# Patient Record
Sex: Female | Born: 1985 | Race: White | Hispanic: No | Marital: Married | Smoking: Never smoker
Health system: Southern US, Community
[De-identification: ages and names within clinical notes are randomized; demographics above are authoritative.]

---

## 2013-10-30 DIAGNOSIS — Z2913 Encounter for prophylactic Rho(D) immune globulin: Secondary | ICD-10-CM | POA: Insufficient documentation

## 2013-11-26 DIAGNOSIS — Z331 Pregnant state, incidental: Secondary | ICD-10-CM | POA: Insufficient documentation

## 2013-11-26 DIAGNOSIS — R6889 Other general symptoms and signs: Secondary | ICD-10-CM | POA: Insufficient documentation

## 2013-12-23 DIAGNOSIS — O3500X0 Maternal care for (suspected) central nervous system malformation or damage in fetus, unspecified, not applicable or unspecified: Secondary | ICD-10-CM | POA: Insufficient documentation

## 2014-04-22 DIAGNOSIS — A491 Streptococcal infection, unspecified site: Secondary | ICD-10-CM | POA: Insufficient documentation

## 2014-09-16 ENCOUNTER — Ambulatory Visit
Admission: EM | Admit: 2014-09-16 | Discharge: 2014-09-16 | Disposition: A | Discharge status: Home / Self Care | Payer: No Typology Code available for payment source | Attending: Emergency Medicine | Admitting: Emergency Medicine

## 2014-09-16 DIAGNOSIS — T148 Other injury of unspecified body region: Secondary | ICD-10-CM

## 2014-09-16 DIAGNOSIS — B349 Viral infection, unspecified: Secondary | ICD-10-CM | POA: Diagnosis not present

## 2014-09-16 DIAGNOSIS — T148XXA Other injury of unspecified body region, initial encounter: Secondary | ICD-10-CM

## 2014-09-16 LAB — RAPID STREP SCREEN (MED CTR MEBANE ONLY): STREPTOCOCCUS, GROUP A SCREEN (DIRECT): NEGATIVE

## 2014-09-16 MED ORDER — IBUPROFEN 800 MG PO TABS
800.0000 mg | ORAL_TABLET | Freq: Once | ORAL | Status: AC
  Administered 2014-09-16: 800 mg via ORAL

## 2014-09-16 NOTE — ED Notes (Signed)
Pt states "I have forearm, calf, ankle and feet pain. It is hard to open a door or pick up my four month old baby. I did fly to [place] on Monday and back Wednesday. I have a little bit of a sore throat but otherwise feel good."

## 2014-09-16 NOTE — ED Provider Notes (Signed)
CSN: [id number]     Arrival date & time 09/16/14  1639 History   First MD Initiated Contact with Patient 09/16/14 1710     Chief Complaint  Patient presents with  . Arm Pain  . Leg Pain   (Consider location/radiation/quality/duration/timing/severity/associated sxs/prior Treatment) HPI   29 yo F spent last 4 days in [place] arriving home late last night. Has had a scratchy throat. Has bilateral forearm discomfort, bilateral calf discomfort and bilateral ankle discomfort. Wore sneakers for most of the trip .Did not  go hiking. Conferences on second floor of building  ,Had bookbag and pulled suitcase. Husband travelled as well and has no discomfort.  He is taller and she has to push to keep up with him at fast walk pace/airports and sidewalks.. She is on Mirena for [facility] Took one tylenol and one ibuprofen over the past 2 days for therapy.  Her mother is concerned that she be seen - mom has been home with the  63 mos old baby Afebrile , VS good History reviewed. No pertinent past medical history. History reviewed. No pertinent past surgical history. History reviewed. No pertinent family history. Social History  Substance Use Topics  . Smoking status: Never Smoker   . Smokeless tobacco: None  . Alcohol Use: No   OB History    Gravida Para Term Preterm AB TAB SAB Ectopic Multiple Living   1 1        1       Obstetric Comments   46 month old infant of the home     Review of Systems   Constitutional -afebrile Eyes-denies visual changes ENT- normal voice,reports intermittent scratchy throat  sore throat CV-denies chest pain Resp-denies SOB GI- negative for nausea,vomiting, diarrhea GU- negative for dysuria MSK- negative for back pain, ambulatory; has bilateral forearm , bilateral calf and ankle discomfort- no specific , no numbness or tingling Skin- denies acute changes Neuro- negative headache,focal weakness or numbness    Allergies  Review of patient's allergies indicates no known  allergies.  Home Medications   Prior to Admission medications   Medication Sig Start Date End Date Taking? Authorizing Provider  levonorgestrel (MIRENA) 20 MCG/24HR IUD 1 each by Intrauterine route once.   Yes Historical Provider, MD   Meds Ordered and Administered this Visit   Medications  ibuprofen (ADVIL,MOTRIN) tablet 800 mg (800 mg Oral Given 09/16/14 1725)  well tolerated  BP 122/70 mmHg  Pulse 70  Temp(Src) 98.4 F (36.9 C) (Oral)  Resp 16  Ht 5\' 2"  (1.575 m)  Wt 155 lb (70.308 kg)  BMI 28.34 kg/m2  SpO2 100% No data found.   Physical Exam    Constitutional -alert and oriented,well appearing and in no acute distress Head-atraumatic, normocephalic Eyes- conjunctiva normal, EOMI ,conjugate gaze Nose- no congestion or rhinorrhea Mouth/throat- mucous membranes moist ,scratchy throat; Strep Neg Neck- supple without glandular enlargement CV- regular rate, grossly normal heart sounds,  Resp-no distress, normal respiratory effort,clear to auscultation bilaterally Back- no CVAT, no spinal column tenderness or paraspinous spasm /tenderness palpated, FROM GI- soft,non-tender,no distention GU-deferred MSK-Bilateral forearms  Reported as tender/ FROM , good pulses, warm , good cap fill, no numbness or tingling , good grips equal, normal ROM,  Bilateral legs with concern of bilateral calf tenderness. Used to be softball player, calves are well developed.  No point tenderness , good pulses, warm toes good cap fill. all extremities, ambulatory, can toe walk and heel walk, squat and return without support. Drove self here, ambulatory  in unit.  Self-care Neuro- normal speech and language, no gross focal neurological deficit appreciated, no gait instability, Skin-warm,dry ,intact; no rash noted Psych-mood and affect grossly normal; speech and behavior grossly normal  ED Course  Procedures (including critical care time)  Labs Review Labs Reviewed  RAPID STREP SCREEN (NOT AT [facility])   CULTURE, GROUP A STREP ([facility] ONLY)   Results for orders placed or performed during the hospital encounter of 09/16/14  Rapid strep screen  Result Value Ref Range   Streptococcus, Group A Screen (Direct) NEGATIVE NEGATIVE  Culture, group A strep ([facility] only)  Result Value Ref Range   Specimen Description THROAT    Special Requests NONE    Culture NO BETA STREPTOCOCCUS ISOLATED IN 12 HOURS    Report Status PENDING    Imaging Review No results found.    Nothing in her exam suggests focal pain,heat , erythema  or DVT- but rather generalized muscle tenderness,from strain and travel. Discussed signs and symptoms to respond to for increasing issues. Also reviewed Neg Strep and 3 day call back. Viral issues for fatigue and scratchy throat She has been on closed conference rooms, planes and waiting rooms . Encourage increased po fluids, tylenol/ibprofen, rest as able, return to stretch program she remembers from team. Report back if low dose Rx doesn't adequately control discomfort if taken regularly for a few days  MDM   1. Muscle strain   2. Viral syndrome    Diagnosis and treatment discussed.  Questions fielded, expectations and recommendations reviewed.  Patient expresses understanding. Will return to [facility] with questions, concern or exacerbation.     Rae Halsted, PA-C 09/17/14 1818

## 2014-09-19 LAB — CULTURE, GROUP A STREP (THRC)

## 2014-09-22 NOTE — ED Provider Notes (Signed)
CSN: [id number]     Arrival date & time 09/16/14  1639 History   First MD Initiated Contact with Patient 09/16/14 1710     Chief Complaint  Patient presents with  . Arm Pain  . Leg Pain   (Consider location/radiation/quality/duration/timing/severity/associated sxs/prior Treatment) HPI  History reviewed. No pertinent past medical history. History reviewed. No pertinent past surgical history. History reviewed. No pertinent family history. Social History  Substance Use Topics  . Smoking status: Never Smoker   . Smokeless tobacco: None  . Alcohol Use: No   OB History    Gravida Para Term Preterm AB TAB SAB Ectopic Multiple Living   Obstetric Comments   58 month old infant of the home     Review of Systems  Allergies  Review of patient's allergies indicates no known allergies.  Home Medications   Prior to Admission medications   Medication Sig Start Date End Date Taking? Authorizing Provider  levonorgestrel (MIRENA) 20 MCG/24HR IUD 1 each by Intrauterine route once.   Yes Historical Provider, MD   Meds Ordered and Administered this Visit   Medications  ibuprofen (ADVIL,MOTRIN) tablet 800 mg (800 mg Oral Given 09/16/14 1725)    BP 122/70 mmHg  Pulse 70  Temp(Src) 98.4 F (36.9 C) (Oral)  Resp 16  Ht  (1.575 m)  Wt 155 lb (70.308 kg)  BMI 28.34 kg/m2  SpO2 100% No data found.   Physical Exam  ED Course  Procedures (including critical care time)  Labs Review Labs Reviewed  RAPID STREP SCREEN (NOT AT [facility])  CULTURE, GROUP A STREP ([facility] ONLY)    Imaging Review No results found.   Visual Acuity Review  Right Eye Distance:   Left Eye Distance:   Bilateral Distance:    Right Eye Near:   Left Eye Near:    Bilateral Near:         MDM   1. Muscle strain   2. Viral syndrome    8/31- strep cx neg - Konrad Saha, MD  Domenick Gong, MD 09/22/14 [phone]

## 2015-06-16 ENCOUNTER — Ambulatory Visit (INDEPENDENT_AMBULATORY_CARE_PROVIDER_SITE_OTHER): Payer: BLUE CROSS/BLUE SHIELD

## 2015-06-16 ENCOUNTER — Ambulatory Visit
Admission: EM | Admit: 2015-06-16 | Discharge: 2015-06-16 | Disposition: A | Discharge status: Home / Self Care | Payer: BLUE CROSS/BLUE SHIELD | Attending: Emergency Medicine | Admitting: Emergency Medicine

## 2015-06-16 ENCOUNTER — Encounter: Payer: Self-pay | Admitting: *Deleted

## 2015-06-16 DIAGNOSIS — M67431 Ganglion, right wrist: Secondary | ICD-10-CM | POA: Diagnosis not present

## 2015-06-16 MED ORDER — IBUPROFEN 800 MG PO TABS: 800.0000 mg | ORAL_TABLET | Freq: Three times a day (TID) | ORAL | Status: DC | PRN

## 2015-06-16 NOTE — ED Notes (Signed)
Patient started having right wrist pain 2 weeks ago and symptoms have not resolved. Patient does have a history of right wrist pain due to sports injuries.

## 2015-06-16 NOTE — ED Provider Notes (Signed)
HPI  SUBJECTIVE:  Patricia West is a 30 y.o. female who presents with painful swelling on the dorsum of her right wrist starting approximately 2 weeks ago. 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 No numbness, tingling, color changes, recent trauma to her wrists, numbness, tingling, pain waking her up at night, erythema, fevers. Symptoms are worse with hyperextension, she has tried an Ace wrap. There are no alleviating factors. No antipyretic in the past 6-8 hours. Pain is not associated with wrist flexion or radial or ulnar deviation. Past medical history of right wrist fracture. No history of diabetes, hypertension, smoking. LMP: Amenorrheic secondary to IUD, denies possibility of being pregnant. PMD: None.   History reviewed. No pertinent past medical history.  History reviewed. No pertinent past surgical history.  History reviewed. No pertinent family history.  Social History  Substance Use Topics  . Smoking status: Never Smoker   . Smokeless tobacco: Never Used  . Alcohol Use: No    No current facility-administered medications for this encounter.  Current outpatient prescriptions:  .  ibuprofen (ADVIL,MOTRIN) 800 MG tablet, Take 1 tablet (800 mg total) by mouth every 8 (eight) hours as needed for mild pain or moderate pain., Disp: 30 tablet, Rfl: 0 .  levonorgestrel (MIRENA) 20 MCG/24HR IUD, 1 each by Intrauterine route once., Disp: , Rfl:   No Known Allergies   ROS  As noted in HPI.   Physical Exam  BP 114/73 mmHg  Pulse 71  Temp(Src) 98.2 F (36.8 C) (Oral)  Resp 18  Ht  (1.6 m)  Wt 165 lb (74.844 kg)  BMI 29.24 kg/m2  SpO2 100%  Constitutional: Well developed, well nourished, no acute distress Eyes:  EOMI, conjunctiva normal bilaterally HENT: Normocephalic, atraumatic,mucus membranes moist Respiratory: Normal inspiratory effort Cardiovascular: Normal rate GI: nondistended skin: No rash, skin  intact Musculoskeletal: Mildly tender swelling right dorsum hand consistent with a ganglion cyst, no erythema, increased temperature. distal radius NT, distal ulnar styloid NT, snuffbox NT, carpals NT, metacarpals NT, digits NT, sensation and motor intact in the median/radial/ulnar distribution. Cap refill less than 2 seconds distally.  Elbow and proximal forearm NT. Neurologic: Alert & oriented x 3, no focal neuro deficits Psychiatric: Speech and behavior appropriate   ED Course   Medications - No data to display  Orders Placed This Encounter  Procedures  . DG Wrist Complete Right    Standing Status: Standing     Number of Occurrences: 1     Standing Expiration Date:     Order Specific Question:  Reason for Exam (SYMPTOM  OR DIAGNOSIS REQUIRED)    Answer:  r/o fx, dislocation    No results found for this or any previous visit (from the past 24 hour(s)). Dg Wrist Complete Right  06/16/2015  CLINICAL DATA:  Right wrist pain. EXAM: RIGHT WRIST - COMPLETE 3+ VIEW COMPARISON:  None. FINDINGS: There is no evidence of fracture or dislocation. There is no evidence of arthropathy or other focal bone abnormality. Soft tissues are unremarkable. IMPRESSION: Negative. Electronically Signed   By: Ted Mcalpine M.D.   On: 06/16/2015 19:47    ED Clinical Impression  Ganglion cyst of wrist, right   ED Assessment/Plan  Presentation consistent with a noninfected ganglion cyst. Obtaining wrist x-ray given remote history of trauma to rule out any acute changes.  Imaging independently reviewed.  No fracture, dislocation. See radiology report for details.   Home with ibuprofen. Patient declined a wrist  splint and a prescription for tramadol. Will refer to Dr. Amanda PeaGramig or  Dr. Melvyn Novasrtmann [facility] for further management.   Discussed  imaging, MDM, plan and followup with patient . Discussed sn/sx that should prompt return to the ED. Patient  agrees with plan.   *This clinic note was  created using Dragon dictation software. Therefore, there may be occasional mistakes despite careful proofreading.  ?    Domenick GongAshley Juda Toepfer, MD 06/16/15 2110

## 2017-04-25 IMAGING — CR DG WRIST COMPLETE 3+V*R*
4 series · 4 of 4 positions shown · non-contrast
Comparison: None.

CLINICAL DATA: Right wrist pain.

EXAM:
RIGHT WRIST - COMPLETE 3+ VIEW

[wrist pa]
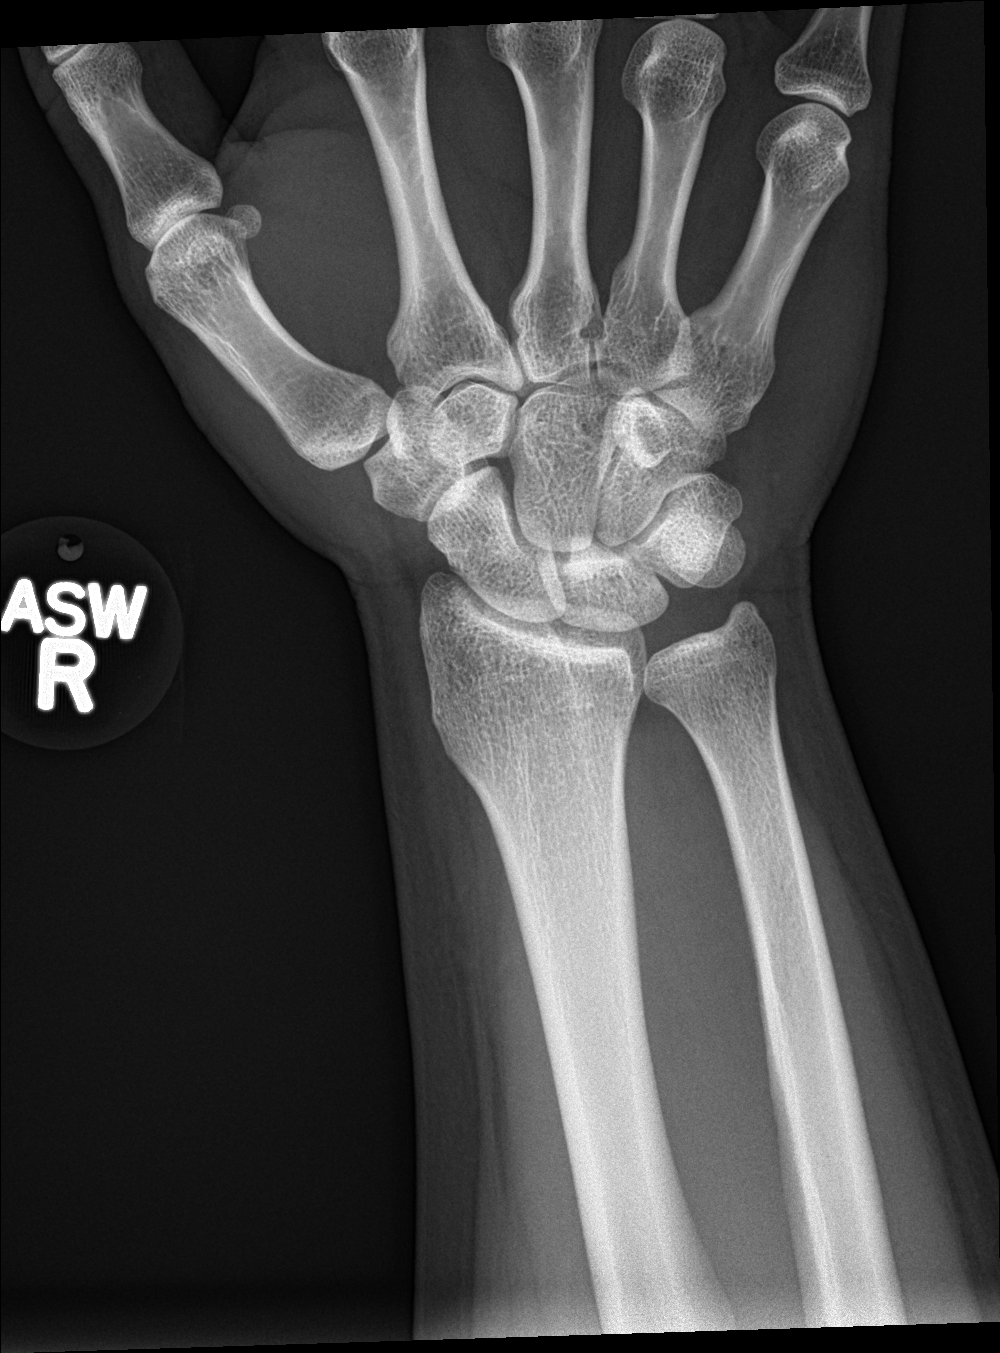

[wrist obl]
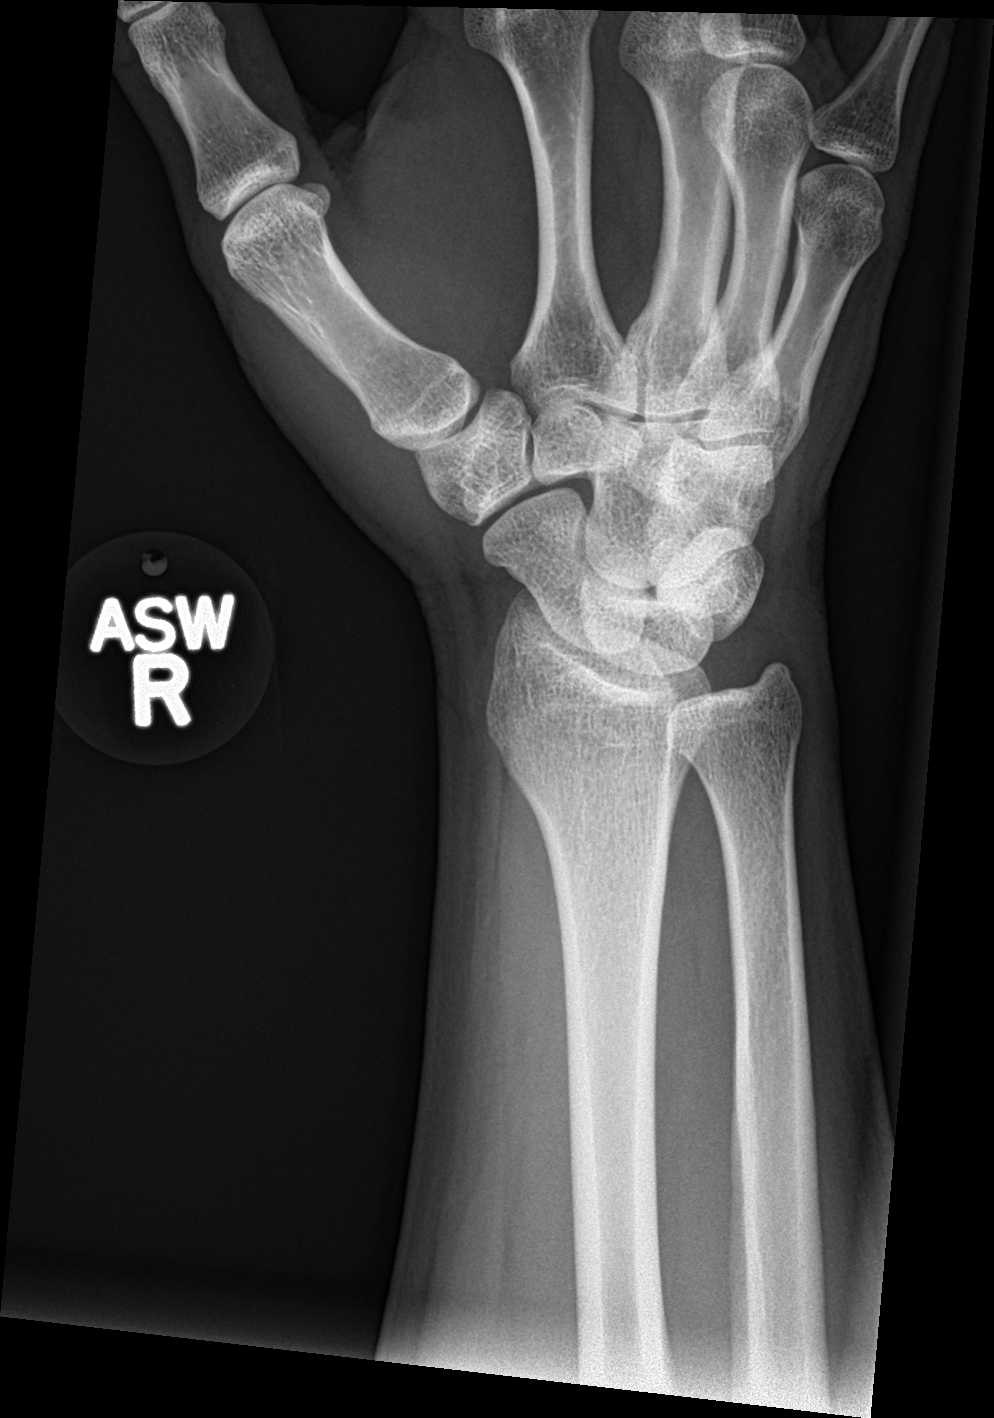

[wrist lat]
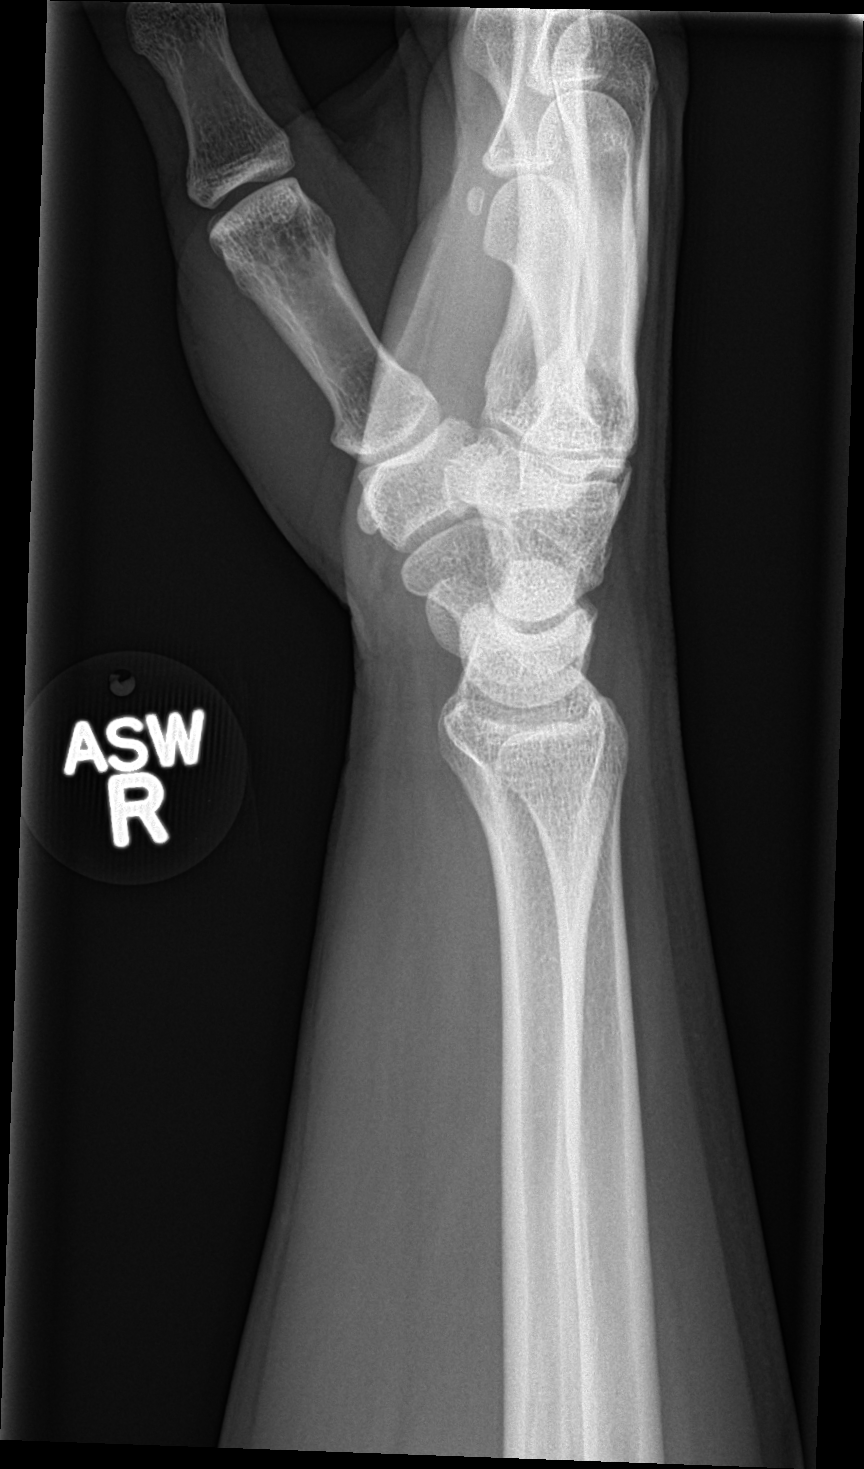

[wrist navicular]
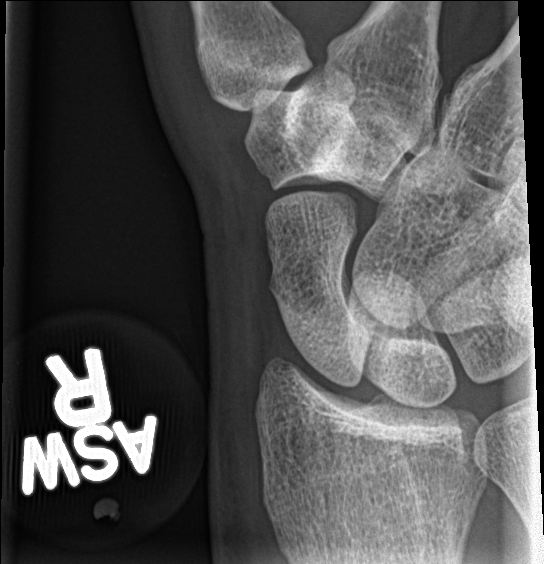

[4 of 4 positions shown; findings below may reference images not displayed]

FINDINGS: There is no evidence of fracture or dislocation. There is no
evidence of arthropathy or other focal bone abnormality. Soft
tissues are unremarkable.
IMPRESSION: Negative.

## 2019-01-19 DIAGNOSIS — M5416 Radiculopathy, lumbar region: Secondary | ICD-10-CM | POA: Insufficient documentation

## 2019-01-20 ENCOUNTER — Ambulatory Visit (INDEPENDENT_AMBULATORY_CARE_PROVIDER_SITE_OTHER): Payer: BLUE CROSS/BLUE SHIELD | Admitting: Podiatry

## 2019-01-20 ENCOUNTER — Other Ambulatory Visit: Payer: Self-pay

## 2019-01-20 ENCOUNTER — Ambulatory Visit (INDEPENDENT_AMBULATORY_CARE_PROVIDER_SITE_OTHER): Payer: BLUE CROSS/BLUE SHIELD

## 2019-01-20 ENCOUNTER — Encounter: Payer: Self-pay | Admitting: Podiatry

## 2019-01-20 VITALS — BP 90/53

## 2019-01-20 DIAGNOSIS — M2141 Flat foot [pes planus] (acquired), right foot: Secondary | ICD-10-CM

## 2019-01-20 DIAGNOSIS — M2010 Hallux valgus (acquired), unspecified foot: Secondary | ICD-10-CM

## 2019-01-20 DIAGNOSIS — M2142 Flat foot [pes planus] (acquired), left foot: Secondary | ICD-10-CM

## 2019-01-20 DIAGNOSIS — M79672 Pain in left foot: Secondary | ICD-10-CM | POA: Diagnosis not present

## 2019-01-20 DIAGNOSIS — M79671 Pain in right foot: Secondary | ICD-10-CM

## 2019-01-20 DIAGNOSIS — L600 Ingrowing nail: Secondary | ICD-10-CM | POA: Diagnosis not present

## 2019-01-20 NOTE — Progress Notes (Signed)
Subjective:  Patient ID: Patricia West, female    DOB: [birth year],  MRN: [id number]  Chief Complaint  Patient presents with  . Foot Pain    pt has bil bunions, going on for multiple years, pt also states that pain is aggravated at night time, pt also states that she has a possible ingrown toenail of the right big toenail lateral side    33 y.o. female presents with the above complaint.  Patient presented with primary complaint of right lateral ingrown hallux/big toe.  Patient states been going for about area.  Patient states the ingrown is on and off.  Is worse when ambulating with sneakers.  Patient said there has not been pus or swelling but has been causing her a lot of pain and discomfort.  She denies any other treatment options.  She denies any other acute complaints.  Patient also has a secondary complaint of bilateral bunions.  They have been going on for 17 years.  Patient was very active in high school and had orthotics made about 17 years ago.  Patient states that she has been wearing it mostly except for last couple of years.  Patient states that he causes an achy sensation to both of the bunion especially wearing her Nike shoes.  I educated her on the importance of changing the shoe gears with new balance Brooks or [employer]ics.  Patient states that she will do that right away.  She states it is aggravated when going to bed after she has been on her feet.  Patient said he has tried inserts but has not been helping.  She would also like to get new custom-made orthotics.   Review of Systems: Negative except as noted in the HPI. Denies N/V/F/Ch.  No past medical history on file.  Current Outpatient Medications:  .  ibuprofen (ADVIL,MOTRIN) 800 MG tablet, Take 1 tablet (800 mg total) by mouth every 8 (eight) hours as needed for mild pain or moderate pain., Disp: 30 tablet, Rfl: 0 .  levonorgestrel (MIRENA) 20 MCG/24HR IUD, 1 each by Intrauterine route once., Disp: , Rfl:   Social History    Tobacco Use  Smoking Status Never Smoker  Smokeless Tobacco Never Used    No Known Allergies Objective:   Vitals:   01/20/19 0947  BP: (!) 90/53   There is no height or weight on file to calculate BMI. Constitutional Well developed. Well nourished.  Vascular Dorsalis pedis pulses palpable bilaterally. Posterior tibial pulses palpable bilaterally. Capillary refill normal to all digits.  No cyanosis or clubbing noted. Pedal hair growth normal.  Neurologic Normal speech. Oriented to person, place, and time. Epicritic sensation to light touch grossly present bilaterally.  Dermatologic Painful ingrowing nail at lateral nail borders of the hallux nail right. No other open wounds. No skin lesions.  Orthopedic: Normal joint ROM without pain or crepitus bilaterally. No visible deformities. No bony tenderness.   Radiographs: Bilateral AP and lateral views of skeletally mature adult foot were reviewed: There is an increase in soft tissue density and volume around the first metatarsophalangeal joint.  There is hypertrophy of medial eminence.  There is subchondral sclerosis present at the base of the proximal phalanx.  There is an increase in intermetatarsal angle the sesamoid position is 5 out of 7.  There is a mild dorsal elevatus present bilaterally. No other bony deformities identified. Assessment:   1. Acquired hallux valgus, unspecified laterality   2. Ingrown toenail of right foot   3. Pain in right foot  4. Pes planus of both feet    Plan:  Patient was evaluated and treated and all questions answered.  Ingrown Nail, right -Patient elects to proceed with minor surgery to remove ingrown toenail removal today. Consent reviewed and signed by patient. -Ingrown nail excised. See procedure note. -Educated on post-procedure care including soaking. Written instructions provided and reviewed. -Patient to follow up in 2 weeks for nail check.  Bilateral hallux abductovalgus  deformities noted right worse than left secondary to pes planus deformity flexible. -I explained to the patient the etiology of bunion deformity and various treatment options associated with it.  I educated her on the importance of bunion protector as well as shoe gear modification with wider toe box to help with the pain and the rubbing.  Patient states that she will obtain those type of shoes. -I believe patient will benefit from custom-made orthotics to help address the hindfoot motion as well as ankle support the arches of the foot and possibly prevent progression of the bunion from getting worse. -Patient will be scheduled to see [facility] for custom-made orthotics.  Procedure: Excision of Ingrown Toenail Location: Right 1st toe lateral nail borders. Anesthesia: Lidocaine 1% plain; 1.5 mL and Marcaine 0.5% plain; 1.5 mL, digital block. Skin Prep: Betadine. Dressing: Silvadene; telfa; dry, sterile, compression dressing. Technique: Following skin prep, the toe was exsanguinated and a tourniquet was secured at the base of the toe. The affected nail border was freed, split with a nail splitter, and excised. Chemical matrixectomy was then performed with phenol and irrigated out with alcohol. The tourniquet was then removed and sterile dressing applied. Disposition: Patient tolerated procedure well. Patient to return in 2 weeks for follow-up.   Return in about 3 months (around 04/20/2019), or f/u for ingrown, for also See [facility] for orthotics.

## 2019-01-20 NOTE — Patient Instructions (Signed)
Pre-Operative Instructions  Congratulations, you have decided to take an important step towards improving your quality of life.  You can be assured that the doctors and staff at [facility] will be with you every step of the way.  Here are some important things you should know:  1. Plan to be at the surgery center/hospital at least 1 (one) hour prior to your scheduled time, unless otherwise directed by the surgical center/hospital staff.  You must have a responsible adult accompany you, remain during the surgery and drive you home.  Make sure you have directions to the surgical center/hospital to ensure you arrive on time. 2. If you are having surgery at [facility] or [facility], you will need a copy of your medical history and physical form from your family physician within one month prior to the date of surgery. We will give you a form for your primary physician to complete.  3. We make every effort to accommodate the date you request for surgery.  However, there are times where surgery dates or times have to be moved.  We will contact you as soon as possible if a change in schedule is required.   4. No aspirin/ibuprofen for one week before surgery.  If you are on aspirin, any non-steroidal anti-inflammatory medications (Mobic, Aleve, Ibuprofen) should not be taken seven (7) days prior to your surgery.  You make take Tylenol for pain prior to surgery.  5. Medications - If you are taking daily heart and blood pressure medications, seizure, reflux, allergy, asthma, anxiety, pain or diabetes medications, make sure you notify the surgery center/hospital before the day of surgery so they can tell you which medications you should take or avoid the day of surgery. 6. No food or drink after midnight the night before surgery unless directed otherwise by surgical center/hospital staff. 7. No alcoholic beverages 24-hours prior to surgery.  No smoking 24-hours prior or 24-hours after  surgery. 8. Wear loose pants or shorts. They should be loose enough to fit over bandages, boots, and casts. 9. Don't wear slip-on shoes. Sneakers are preferred. 10. Bring your boot with you to the surgery center/hospital.  Also bring crutches or a walker if your physician has prescribed it for you.  If you do not have this equipment, it will be provided for you after surgery. 11. If you have not been contacted by the surgery center/hospital by the day before your surgery, call to confirm the date and time of your surgery. 12. Leave-time from work may vary depending on the type of surgery you have.  Appropriate arrangements should be made prior to surgery with your employer. 13. Prescriptions will be provided immediately following surgery by your doctor.  Fill these as soon as possible after surgery and take the medication as directed. Pain medications will not be refilled on weekends and must be approved by the doctor. 14. Remove nail polish on the operative foot and avoid getting pedicures prior to surgery. 15. Wash the night before surgery.  The night before surgery wash the foot and leg well with water and the antibacterial soap provided. Be sure to pay special attention to beneath the toenails and in between the toes.  Wash for at least three (3) minutes. Rinse thoroughly with water and dry well with a towel.  Perform this wash unless told not to do so by your physician.  Enclosed: 1 Ice pack (please put in freezer the night before surgery)   1 Hibiclens skin cleaner     Pre-op instructions  If you have any questions regarding the instructions, please do not hesitate to call our office.  [place]: [address], [place], [place] [address] -- [phone]  [place]: [address] [place], [place] [address] -- [phone]  [place]: [address], [place], [place] [address] -- [phone]   Website: [email or web address] 

## 2019-02-11 ENCOUNTER — Ambulatory Visit (INDEPENDENT_AMBULATORY_CARE_PROVIDER_SITE_OTHER): Payer: BLUE CROSS/BLUE SHIELD | Admitting: Orthotics

## 2019-02-11 ENCOUNTER — Other Ambulatory Visit: Payer: Self-pay

## 2019-02-11 DIAGNOSIS — M2141 Flat foot [pes planus] (acquired), right foot: Secondary | ICD-10-CM | POA: Diagnosis not present

## 2019-02-11 DIAGNOSIS — M2142 Flat foot [pes planus] (acquired), left foot: Secondary | ICD-10-CM

## 2019-02-11 DIAGNOSIS — M2010 Hallux valgus (acquired), unspecified foot: Secondary | ICD-10-CM

## 2019-02-11 NOTE — Progress Notes (Signed)
Patient came into today to be cast for Custom Foot Orthotics. Upon recommendation of Dr.  Allena Katz Patient presents with b/L hallux valgus Goals are arch support, offload 1st mpj b/l Plan vendor Richy

## 2019-03-17 ENCOUNTER — Encounter: Payer: Self-pay | Admitting: Podiatry

## 2019-03-17 ENCOUNTER — Other Ambulatory Visit: Payer: Self-pay

## 2019-03-17 ENCOUNTER — Ambulatory Visit: Payer: BLUE CROSS/BLUE SHIELD | Admitting: Orthotics

## 2019-03-17 DIAGNOSIS — M2010 Hallux valgus (acquired), unspecified foot: Secondary | ICD-10-CM

## 2019-03-17 DIAGNOSIS — M2142 Flat foot [pes planus] (acquired), left foot: Secondary | ICD-10-CM

## 2019-03-17 DIAGNOSIS — M2141 Flat foot [pes planus] (acquired), right foot: Secondary | ICD-10-CM

## 2019-03-17 NOTE — Progress Notes (Signed)
Patient came in today to pick up custom made foot orthotics.  The goals were accomplished and the patient reported no dissatisfaction with said orthotics.  Patient was advised of breakin period and how to report any issues. 

## 2019-03-25 ENCOUNTER — Other Ambulatory Visit: Payer: BLUE CROSS/BLUE SHIELD | Admitting: Orthotics

## 2019-04-21 ENCOUNTER — Ambulatory Visit: Payer: BLUE CROSS/BLUE SHIELD | Admitting: Podiatry

## 2023-08-04 ENCOUNTER — Encounter: Payer: Self-pay | Admitting: Emergency Medicine

## 2023-08-04 ENCOUNTER — Ambulatory Visit
Admission: EM | Admit: 2023-08-04 | Discharge: 2023-08-04 | Disposition: A | Discharge status: Home / Self Care | Attending: Emergency Medicine | Admitting: Emergency Medicine

## 2023-08-04 DIAGNOSIS — L247 Irritant contact dermatitis due to plants, except food: Secondary | ICD-10-CM

## 2023-08-04 MED ORDER — DEXAMETHASONE SODIUM PHOSPHATE 10 MG/ML IJ SOLN
10.0000 mg | Freq: Once | INTRAMUSCULAR | Status: AC
  Administered 2023-08-04: 10 mg via INTRAMUSCULAR

## 2023-08-04 MED ORDER — PREDNISONE 10 MG (21) PO TBPK: ORAL_TABLET | Freq: Every day | ORAL | 0 refills | Status: DC

## 2023-08-04 NOTE — ED Triage Notes (Signed)
 Pt has rash on bilateral legs and face. She states she cut down some trees and a week alter she started getting a rash. She believes it poison oak. Pt left eye is swollen but states it does not itch. Pt states her legs itch. Pt denies shortness of breath, or throat  swelling.

## 2023-08-04 NOTE — ED Provider Notes (Signed)
 [facility]    CSN: [id number] Arrival date & time: 08/04/23  1027      History   Chief Complaint Chief Complaint  Patient presents with   Rash    HPI Patricia West is a 38 y.o. female.   HPI  38 year old female with past medical history significant for lumbar radiculopathy presents for evaluation of a rash on both legs and face.  She reports that approximately 2 weeks ago she cut down a tree and then 5 days ago the rash developed on both of her legs and then 3 days ago it developed on her face.  She describes the rash as being itchy and notes that it has drained some yellow fluid.  She denies any changes in vision, fever, or respiratory issues.  History reviewed. No pertinent past medical history.  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 No pertinent surgical history.  OB History     Gravida  1   Para  1   Term      Preterm      AB      Living  1      SAB      IAB      Ectopic      Multiple      Live Births           Obstetric Comments  67 month old infant of the home          Home Medications    Prior to Admission medications   Medication Sig Start Date End Date Taking? Authorizing Provider  levonorgestrel (MIRENA) 20 MCG/24HR IUD 1 each by Intrauterine route once.   Yes [provider]  predniSONE  (STERAPRED UNI-PAK 21 TAB) 10 MG (21) TBPK tablet Take by mouth daily. Take 6 tabs by mouth daily  for 2 days, then 5 tabs for 2 days, then 4 tabs for 2 days, then 3 tabs for 2 days, 2 tabs for 2 days, then 1 tab by mouth daily for 2 days 08/04/23  Yes Bernardino Ditch, NP    Family History History  reviewed. No pertinent family history.  Social History Social History   Tobacco Use   Smoking status: Never   Smokeless tobacco: Never  Vaping Use   Vaping status: Never Used  Substance Use Topics   Alcohol use: No   Drug use: No     Allergies   Patient has no known allergies.   Review of Systems Review of Systems  Constitutional:  Negative for fever.  HENT:  Negative for trouble swallowing and voice change.   Eyes:  Negative for visual disturbance.  Respiratory:  Negative for shortness of breath, wheezing and stridor.   Skin:  Positive for rash.     Physical Exam Triage Vital Signs ED Triage Vitals  Encounter Vitals Group     BP 08/04/23 1127 129/82     Girls Systolic BP Percentile --      Girls Diastolic BP Percentile --      Boys Systolic BP Percentile --      Boys Diastolic BP Percentile --      Pulse Rate 08/04/23 1127 72     Resp  08/04/23 1127 16     Temp 08/04/23 1127 98.8 F (37.1 C)     Temp Source 08/04/23 1127 Oral     SpO2 08/04/23 1127 97 %     Weight 08/04/23 1126 164 lb 14.5 oz (74.8 kg)     Height 08/04/23 1126 5' 3 (1.6 m)     Head Circumference --      Peak Flow --      Pain Score 08/04/23 1126 0     Pain Loc --      Pain Education --      Exclude from Growth Chart --    No data found.  Updated Vital Signs BP 129/82 (BP Location: Right Arm)   Pulse 72   Temp 98.8 F (37.1 C) (Oral)   Resp 16   Ht 5' 3 (1.6 m)   Wt 164 lb 14.5 oz (74.8 kg)   SpO2 97%   BMI 29.21 kg/m   Visual Acuity Right Eye Distance:   Left Eye Distance:   Bilateral Distance:    Right Eye Near:   Left Eye Near:    Bilateral Near:     Physical Exam Vitals and nursing note reviewed.  Constitutional:      Appearance: Normal appearance. She is not ill-appearing.  HENT:     Head: Normocephalic and atraumatic.     Mouth/Throat:     Mouth: Mucous membranes are moist.     Pharynx: Oropharynx is clear. No oropharyngeal exudate or posterior oropharyngeal  erythema.  Eyes:     General:        Right eye: No discharge.        Left eye: No discharge.     Extraocular Movements: Extraocular movements intact.     Conjunctiva/sclera: Conjunctivae normal.     Pupils: Pupils are equal, round, and reactive to light.  Cardiovascular:     Rate and Rhythm: Normal rate and regular rhythm.     Pulses: Normal pulses.     Heart sounds: Normal heart sounds. No murmur heard.    No friction rub. No gallop.  Pulmonary:     Effort: Pulmonary effort is normal.     Breath sounds: Normal breath sounds. No stridor. No wheezing, rhonchi or rales.  Skin:    General: Skin is warm and dry.     Capillary Refill: Capillary refill takes less than 2 seconds.     Findings: Rash present.  Neurological:     General: No focal deficit present.     Mental Status: She is alert.      UC Treatments / Results  Labs (all labs ordered are listed, but only abnormal results are displayed) Labs Reviewed - No data to display  EKG   Radiology No results found.  Procedures Procedures (including critical care time)  Medications Ordered in UC Medications - No data to display  Initial Impression / Assessment and Plan / UC Course  I have reviewed the triage vital signs and the nursing notes.  Pertinent labs & imaging results that were available during my care of the patient were reviewed by me and considered in my medical decision making (see chart for details).   Patient is a pleasant, nontoxic-appearing 38 year old female presenting for evaluation of contact dermatitis on her face and both lower extremities.  She believes she came in contact with poison oak when she cut down a tree 2 weeks ago.  The rash did not develop until 5 days ago.  She does have swelling  around her left eye but she denies any changes in her vision.  Her pupils equal round reactive and EOM is intact.  There is no discharge.      Patient's airway is patent and there is no stridor when  auscultating over the trachea.  Lung sounds are clear to auscultation all fields. I will have staff administer 10 mg of IM Decadron  in clinic and then discharge the patient home on a 12-day prednisone  taper for treatment of contact dermatitis.  I have advised her that she can apply calamine lotion to the rash on her legs but she should avoid putting it on her face.  If she starts to develop any changes in vision in her left eye she needs to follow-up with her ophthalmologist.  She may use over-the-counter Claritin, Zyrtec, or Allegra during the day to help with itching and may use Benadryl at nighttime.   Final Clinical Impressions(s) / UC Diagnoses   Final diagnoses:  Irritant contact dermatitis due to plants, except food     Discharge Instructions      Take the prednisone  according to the package instructions.  Use over-the-counter Allegra, Claritin, or Zyrtec during the day as needed for itching and use Benadryl 50 mg at bedtime.  This may also help you sleep as a steroids may interrupt your sleep cycle.  Apply calamine lotion to the rash on your extremities to help dry it up.  Do not use calamine lotion on your face.  For facial lesions, if you develop any changes in your vision or itching and irritation in your eyes please go to the ER for evaluation or follow-up with ophthalmology.      ED Prescriptions     Medication Sig Dispense Auth. Provider   predniSONE  (STERAPRED UNI-PAK 21 TAB) 10 MG (21) TBPK tablet Take by mouth daily. Take 6 tabs by mouth daily  for 2 days, then 5 tabs for 2 days, then 4 tabs for 2 days, then 3 tabs for 2 days, 2 tabs for 2 days, then 1 tab by mouth daily for 2 days 42 tablet Bernardino Ditch, NP      PDMP not reviewed this encounter.   Bernardino Ditch, NP 08/04/23 1143

## 2023-08-04 NOTE — Discharge Instructions (Addendum)
Take the prednisone according to the package instructions.  Use over-the-counter Allegra, Claritin, or Zyrtec during the day as needed for itching and use Benadryl 50 mg at bedtime.  This may also help you sleep as a steroids may interrupt your sleep cycle.  Apply calamine lotion to the rash on your extremities to help dry it up.  Do not use calamine lotion on your face.  For facial lesions, if you develop any changes in your vision or itching and irritation in your eyes please go to the ER for evaluation or follow-up with ophthalmology.  

## 2023-08-12 ENCOUNTER — Ambulatory Visit
Admission: RE | Admit: 2023-08-12 | Discharge: 2023-08-12 | Disposition: A | Discharge status: Home / Self Care | Attending: Family Medicine | Admitting: Family Medicine

## 2023-08-12 VITALS — BP 126/83 | HR 70 | Temp 98.9°F | Resp 16

## 2023-08-12 DIAGNOSIS — L241 Irritant contact dermatitis due to oils and greases: Secondary | ICD-10-CM | POA: Diagnosis not present

## 2023-08-12 MED ORDER — DEXAMETHASONE SODIUM PHOSPHATE 10 MG/ML IJ SOLN
10.0000 mg | Freq: Once | INTRAMUSCULAR | Status: AC
  Administered 2023-08-12: 10 mg via INTRAMUSCULAR

## 2023-08-12 MED ORDER — PREDNISONE 10 MG PO TABS: ORAL_TABLET | ORAL | 0 refills | Status: AC

## 2023-08-12 MED ORDER — HYDROXYZINE HCL 25 MG PO TABS: 25.0000 mg | ORAL_TABLET | Freq: Four times a day (QID) | ORAL | 0 refills | Status: AC

## 2023-08-12 MED ORDER — TRIAMCINOLONE ACETONIDE 0.1 % EX OINT: 1.0000 | TOPICAL_OINTMENT | Freq: Two times a day (BID) | CUTANEOUS | 0 refills | Status: AC

## 2023-08-12 NOTE — Discharge Instructions (Addendum)
 Stop by the pharmacy to pick up your prescriptions.  Follow up with your primary care provider or return to the urgent care, if not improving.   Be sure to take your steroids as prescribed.

## 2023-08-12 NOTE — ED Triage Notes (Signed)
 Came in last Sunday for steroid shot and on prednisone ,  but have new patches on my neck and midsection that aren't getting better. Leave for vacation on Wednesday so wanted to see if could get another shot. - Entered by patient

## 2023-08-12 NOTE — ED Provider Notes (Signed)
 [facility]    CSN: [id number] Arrival date & time: 08/12/23  1029      History   Chief Complaint Chief Complaint  Patient presents with   Poison Ivy    Came in last Sunday for steroid shot and on prednisone ,  but have new patches on my neck and midsection that aren't getting better. Leave for vacation on Wednesday so wanted to see if could get another shot. - Entered by patient    HPI Patricia West is a 38 y.o. female.   HPI  Patricia West presents for worsening poison ivy rash. She reports more spots showing up on her chest/neck and abdomen that are very itchy. She was seen here at the urgent care a few days ago and prescribed steroids and got an injection.     History reviewed. No pertinent past medical history.  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 No pertinent surgical history.  OB History     Gravida  1   Para  1   Term      Preterm      AB      Living  1      SAB      IAB      Ectopic      Multiple      Live Births           Obstetric Comments  45 month old infant of the home          Home Medications    Prior to Admission medications   Medication Sig Start Date End Date Taking? Authorizing Provider  hydrOXYzine  (ATARAX ) 25 MG tablet Take 1 tablet (25 mg total) by mouth every 6 (six) hours. 08/12/23  Yes Waylon Hershey, DO  predniSONE  (DELTASONE ) 10 MG tablet Take 6 tabs by mouth daily  for 2 days, then 5 tabs for 2 days, then 4 tabs for 2 days, then 3 tabs for 2 days, 2 tabs for 2 days, then 1 tab by mouth daily for 2 days 08/12/23  Yes Kanitra Purifoy, DO  triamcinolone  ointment (KENALOG ) 0.1 % Apply 1  Application topically 2 (two) times daily. 08/12/23  Yes Ammar Moffatt, DO  levonorgestrel (MIRENA) 20 MCG/24HR IUD 1 each by Intrauterine route once.    [provider]    Family History History reviewed. No pertinent family history.  Social History Social History   Tobacco Use   Smoking status: Never   Smokeless tobacco: Never  Vaping Use   Vaping status: Never Used  Substance Use Topics   Alcohol use: No   Drug use: No     Allergies   Patient has no known allergies.   Review of Systems Review of Systems :negative unless otherwise stated in HPI.      Physical Exam Triage Vital Signs ED Triage Vitals  Encounter Vitals Group     BP      Girls Systolic BP Percentile      Girls Diastolic BP Percentile      Boys Systolic BP Percentile      Boys Diastolic BP Percentile      Pulse      Resp  Temp      Temp src      SpO2      Weight      Height      Head Circumference      Peak Flow      Pain Score      Pain Loc      Pain Education      Exclude from Growth Chart    No data found.  Updated Vital Signs BP 126/83 (BP Location: Right Arm)   Pulse 70   Temp 98.9 F (37.2 C) (Oral)   Resp 16   SpO2 95%   Visual Acuity Right Eye Distance:   Left Eye Distance:   Bilateral Distance:    Right Eye Near:   Left Eye Near:    Bilateral Near:     Physical Exam  GEN: alert, well appearing female, in no acute distress  CV: regular rate and rhythm  RESP: no increased work of breathing, clear to ascultation bilaterally MSK: no extremity edema  NEURO: alert, moves all extremities appropriately SKIN: warm and dry; erythematous patches on bilateral lower extremities, erythematous patch on abdomen with blisters, erythematous patches on anterior upper chest wall and neck  UC Treatments / Results  Labs (all labs ordered are listed, but only abnormal results are displayed) Labs Reviewed - No data to display  EKG   Radiology No results  found.  Procedures Procedures (including critical care time)  Medications Ordered in UC Medications  dexamethasone  (DECADRON ) injection 10 mg (10 mg Intramuscular Given 08/12/23 1057)    Initial Impression / Assessment and Plan / UC Course  I have reviewed the triage vital signs and the nursing notes.  Pertinent labs & imaging results that were available during my care of the patient were reviewed by me and considered in my medical decision making (see chart for details).       Contact Dermatitis Patient is a 38 y.o. female who presents for worsening rash.  Overall, patient is well-appearing and well-hydrated.  Vital signs stable.  Patricia West is afebrile.  History and exam concerning for contact dermatitis.  Decadron  10 mg IM given.  Treat with prednisone  taper and steroid ointment.  Atarax  for additional itch relief. No sign of infection to suggest antifungals or antibiotics at this time.    Reviewed expectations regarding course of current medical issues.  All questions asked were answered.  Outlined signs and symptoms indicating need for more acute intervention. Patient verbalized understanding. After Visit Summary given.   Final Clinical Impressions(s) / UC Diagnoses   Final diagnoses:  Irritant contact dermatitis due to oils     Discharge Instructions      Stop by the pharmacy to pick up your prescriptions.  Follow up with your primary care provider or return to the urgent care, if not improving.   Be sure to take your steroids as prescribed.      ED Prescriptions     Medication Sig Dispense Auth. Provider   predniSONE  (DELTASONE ) 10 MG tablet Take 6 tabs by mouth daily  for 2 days, then 5 tabs for 2 days, then 4 tabs for 2 days, then 3 tabs for 2 days, 2 tabs for 2 days, then 1 tab by mouth daily for 2 days 33 tablet Zacari Radick, DO   triamcinolone  ointment (KENALOG ) 0.1 % Apply 1 Application topically 2 (two) times daily. 30 g Rynn Markiewicz, DO    hydrOXYzine  (ATARAX ) 25 MG tablet Take 1 tablet (25 mg total)  by mouth every 6 (six) hours. 30 tablet Daisean Brodhead, DO      PDMP not reviewed this encounter.              Proctor Carriker, DO 08/12/23 1255
